# Patient Record
Sex: Female | Born: 1960 | Hispanic: Refuse to answer | Smoking: Current every day smoker
Health system: Southern US, Community
[De-identification: ages and names within clinical notes are randomized; demographics above are authoritative.]

## PROBLEM LIST (undated history)

## (undated) DIAGNOSIS — D649 Anemia, unspecified: Secondary | ICD-10-CM

## (undated) HISTORY — PX: TUBAL LIGATION: SHX77

## (undated) HISTORY — PX: ROTATOR CUFF REPAIR: SHX139

---

## 2015-06-30 DIAGNOSIS — E559 Vitamin D deficiency, unspecified: Secondary | ICD-10-CM | POA: Diagnosis not present

## 2015-06-30 DIAGNOSIS — R6882 Decreased libido: Secondary | ICD-10-CM | POA: Diagnosis not present

## 2015-06-30 DIAGNOSIS — D649 Anemia, unspecified: Secondary | ICD-10-CM | POA: Diagnosis not present

## 2015-06-30 DIAGNOSIS — Z0389 Encounter for observation for other suspected diseases and conditions ruled out: Secondary | ICD-10-CM | POA: Diagnosis not present

## 2015-06-30 DIAGNOSIS — D519 Vitamin B12 deficiency anemia, unspecified: Secondary | ICD-10-CM | POA: Diagnosis not present

## 2015-06-30 DIAGNOSIS — Z13220 Encounter for screening for lipoid disorders: Secondary | ICD-10-CM | POA: Diagnosis not present

## 2015-06-30 DIAGNOSIS — E2839 Other primary ovarian failure: Secondary | ICD-10-CM | POA: Diagnosis not present

## 2015-06-30 DIAGNOSIS — R5383 Other fatigue: Secondary | ICD-10-CM | POA: Diagnosis not present

## 2015-09-21 DIAGNOSIS — E2839 Other primary ovarian failure: Secondary | ICD-10-CM | POA: Diagnosis not present

## 2015-09-21 DIAGNOSIS — E559 Vitamin D deficiency, unspecified: Secondary | ICD-10-CM | POA: Diagnosis not present

## 2015-09-21 DIAGNOSIS — R5383 Other fatigue: Secondary | ICD-10-CM | POA: Diagnosis not present

## 2015-11-03 DIAGNOSIS — D649 Anemia, unspecified: Secondary | ICD-10-CM | POA: Diagnosis not present

## 2015-11-03 DIAGNOSIS — E559 Vitamin D deficiency, unspecified: Secondary | ICD-10-CM | POA: Diagnosis not present

## 2015-11-03 DIAGNOSIS — E2839 Other primary ovarian failure: Secondary | ICD-10-CM | POA: Diagnosis not present

## 2015-12-08 DIAGNOSIS — E2839 Other primary ovarian failure: Secondary | ICD-10-CM | POA: Diagnosis not present

## 2015-12-08 DIAGNOSIS — D649 Anemia, unspecified: Secondary | ICD-10-CM | POA: Diagnosis not present

## 2016-02-10 DIAGNOSIS — E2839 Other primary ovarian failure: Secondary | ICD-10-CM | POA: Diagnosis not present

## 2016-02-10 DIAGNOSIS — E559 Vitamin D deficiency, unspecified: Secondary | ICD-10-CM | POA: Diagnosis not present

## 2016-02-10 DIAGNOSIS — Z713 Dietary counseling and surveillance: Secondary | ICD-10-CM | POA: Diagnosis not present

## 2016-02-10 DIAGNOSIS — D649 Anemia, unspecified: Secondary | ICD-10-CM | POA: Diagnosis not present

## 2016-05-11 DIAGNOSIS — E559 Vitamin D deficiency, unspecified: Secondary | ICD-10-CM | POA: Diagnosis not present

## 2016-05-11 DIAGNOSIS — E2839 Other primary ovarian failure: Secondary | ICD-10-CM | POA: Diagnosis not present

## 2016-05-11 DIAGNOSIS — D649 Anemia, unspecified: Secondary | ICD-10-CM | POA: Diagnosis not present

## 2016-05-19 DIAGNOSIS — D229 Melanocytic nevi, unspecified: Secondary | ICD-10-CM | POA: Diagnosis not present

## 2016-06-22 DIAGNOSIS — R635 Abnormal weight gain: Secondary | ICD-10-CM | POA: Diagnosis not present

## 2016-06-22 DIAGNOSIS — D229 Melanocytic nevi, unspecified: Secondary | ICD-10-CM | POA: Diagnosis not present

## 2016-06-22 DIAGNOSIS — E2839 Other primary ovarian failure: Secondary | ICD-10-CM | POA: Diagnosis not present

## 2016-06-27 DIAGNOSIS — Z720 Tobacco use: Secondary | ICD-10-CM | POA: Diagnosis not present

## 2016-06-27 DIAGNOSIS — R635 Abnormal weight gain: Secondary | ICD-10-CM | POA: Diagnosis not present

## 2016-06-27 DIAGNOSIS — Z9889 Other specified postprocedural states: Secondary | ICD-10-CM | POA: Diagnosis not present

## 2016-06-27 DIAGNOSIS — D518 Other vitamin B12 deficiency anemias: Secondary | ICD-10-CM | POA: Diagnosis not present

## 2016-06-27 DIAGNOSIS — Z6828 Body mass index (BMI) 28.0-28.9, adult: Secondary | ICD-10-CM | POA: Diagnosis not present

## 2016-07-07 DIAGNOSIS — E782 Mixed hyperlipidemia: Secondary | ICD-10-CM | POA: Diagnosis not present

## 2016-07-07 DIAGNOSIS — D518 Other vitamin B12 deficiency anemias: Secondary | ICD-10-CM | POA: Diagnosis not present

## 2016-07-11 DIAGNOSIS — Z6828 Body mass index (BMI) 28.0-28.9, adult: Secondary | ICD-10-CM | POA: Diagnosis not present

## 2016-07-11 DIAGNOSIS — D518 Other vitamin B12 deficiency anemias: Secondary | ICD-10-CM | POA: Diagnosis not present

## 2016-07-11 DIAGNOSIS — Z713 Dietary counseling and surveillance: Secondary | ICD-10-CM | POA: Diagnosis not present

## 2016-07-11 DIAGNOSIS — R635 Abnormal weight gain: Secondary | ICD-10-CM | POA: Diagnosis not present

## 2016-07-11 DIAGNOSIS — Z9889 Other specified postprocedural states: Secondary | ICD-10-CM | POA: Diagnosis not present

## 2016-07-11 DIAGNOSIS — Z720 Tobacco use: Secondary | ICD-10-CM | POA: Diagnosis not present

## 2016-10-10 DIAGNOSIS — Z713 Dietary counseling and surveillance: Secondary | ICD-10-CM | POA: Diagnosis not present

## 2016-10-10 DIAGNOSIS — Z720 Tobacco use: Secondary | ICD-10-CM | POA: Diagnosis not present

## 2016-10-10 DIAGNOSIS — R638 Other symptoms and signs concerning food and fluid intake: Secondary | ICD-10-CM | POA: Diagnosis not present

## 2016-10-10 DIAGNOSIS — Z6826 Body mass index (BMI) 26.0-26.9, adult: Secondary | ICD-10-CM | POA: Diagnosis not present

## 2016-11-02 DIAGNOSIS — B079 Viral wart, unspecified: Secondary | ICD-10-CM | POA: Diagnosis not present

## 2017-05-17 DIAGNOSIS — M542 Cervicalgia: Secondary | ICD-10-CM | POA: Diagnosis not present

## 2017-05-17 DIAGNOSIS — M9901 Segmental and somatic dysfunction of cervical region: Secondary | ICD-10-CM | POA: Diagnosis not present

## 2017-06-20 DIAGNOSIS — Z0000 Encounter for general adult medical examination without abnormal findings: Secondary | ICD-10-CM | POA: Diagnosis not present

## 2017-06-20 DIAGNOSIS — D518 Other vitamin B12 deficiency anemias: Secondary | ICD-10-CM | POA: Diagnosis not present

## 2017-06-20 DIAGNOSIS — E6609 Other obesity due to excess calories: Secondary | ICD-10-CM | POA: Diagnosis not present

## 2017-06-23 DIAGNOSIS — M542 Cervicalgia: Secondary | ICD-10-CM | POA: Diagnosis not present

## 2017-06-23 DIAGNOSIS — M9901 Segmental and somatic dysfunction of cervical region: Secondary | ICD-10-CM | POA: Diagnosis not present

## 2017-06-23 DIAGNOSIS — H5589 Other irregular eye movements: Secondary | ICD-10-CM | POA: Diagnosis not present

## 2017-06-30 DIAGNOSIS — M9901 Segmental and somatic dysfunction of cervical region: Secondary | ICD-10-CM | POA: Diagnosis not present

## 2017-06-30 DIAGNOSIS — M542 Cervicalgia: Secondary | ICD-10-CM | POA: Diagnosis not present

## 2017-07-07 DIAGNOSIS — M9901 Segmental and somatic dysfunction of cervical region: Secondary | ICD-10-CM | POA: Diagnosis not present

## 2017-07-07 DIAGNOSIS — M542 Cervicalgia: Secondary | ICD-10-CM | POA: Diagnosis not present

## 2017-07-21 DIAGNOSIS — M542 Cervicalgia: Secondary | ICD-10-CM | POA: Diagnosis not present

## 2017-07-21 DIAGNOSIS — M9901 Segmental and somatic dysfunction of cervical region: Secondary | ICD-10-CM | POA: Diagnosis not present

## 2017-08-01 DIAGNOSIS — M542 Cervicalgia: Secondary | ICD-10-CM | POA: Diagnosis not present

## 2017-08-01 DIAGNOSIS — M9901 Segmental and somatic dysfunction of cervical region: Secondary | ICD-10-CM | POA: Diagnosis not present

## 2017-08-03 DIAGNOSIS — H5589 Other irregular eye movements: Secondary | ICD-10-CM | POA: Diagnosis not present

## 2017-08-03 DIAGNOSIS — Z202 Contact with and (suspected) exposure to infections with a predominantly sexual mode of transmission: Secondary | ICD-10-CM | POA: Diagnosis not present

## 2017-08-03 DIAGNOSIS — Z124 Encounter for screening for malignant neoplasm of cervix: Secondary | ICD-10-CM | POA: Diagnosis not present

## 2017-08-03 DIAGNOSIS — D518 Other vitamin B12 deficiency anemias: Secondary | ICD-10-CM | POA: Diagnosis not present

## 2017-08-03 DIAGNOSIS — Z0000 Encounter for general adult medical examination without abnormal findings: Secondary | ICD-10-CM | POA: Diagnosis not present

## 2017-08-03 DIAGNOSIS — Z6827 Body mass index (BMI) 27.0-27.9, adult: Secondary | ICD-10-CM | POA: Diagnosis not present

## 2017-12-21 DIAGNOSIS — R5383 Other fatigue: Secondary | ICD-10-CM | POA: Diagnosis not present

## 2017-12-21 DIAGNOSIS — Z202 Contact with and (suspected) exposure to infections with a predominantly sexual mode of transmission: Secondary | ICD-10-CM | POA: Diagnosis not present

## 2017-12-21 DIAGNOSIS — Z124 Encounter for screening for malignant neoplasm of cervix: Secondary | ICD-10-CM | POA: Diagnosis not present

## 2017-12-21 DIAGNOSIS — E0590 Thyrotoxicosis, unspecified without thyrotoxic crisis or storm: Secondary | ICD-10-CM | POA: Diagnosis not present

## 2017-12-21 DIAGNOSIS — D518 Other vitamin B12 deficiency anemias: Secondary | ICD-10-CM | POA: Diagnosis not present

## 2017-12-21 DIAGNOSIS — H5589 Other irregular eye movements: Secondary | ICD-10-CM | POA: Diagnosis not present

## 2017-12-21 DIAGNOSIS — Z6827 Body mass index (BMI) 27.0-27.9, adult: Secondary | ICD-10-CM | POA: Diagnosis not present

## 2017-12-21 DIAGNOSIS — Z0000 Encounter for general adult medical examination without abnormal findings: Secondary | ICD-10-CM | POA: Diagnosis not present

## 2017-12-21 DIAGNOSIS — E559 Vitamin D deficiency, unspecified: Secondary | ICD-10-CM | POA: Diagnosis not present

## 2017-12-28 DIAGNOSIS — Z6826 Body mass index (BMI) 26.0-26.9, adult: Secondary | ICD-10-CM | POA: Diagnosis not present

## 2017-12-28 DIAGNOSIS — E559 Vitamin D deficiency, unspecified: Secondary | ICD-10-CM | POA: Diagnosis not present

## 2017-12-28 DIAGNOSIS — D518 Other vitamin B12 deficiency anemias: Secondary | ICD-10-CM | POA: Diagnosis not present

## 2017-12-28 DIAGNOSIS — E0590 Thyrotoxicosis, unspecified without thyrotoxic crisis or storm: Secondary | ICD-10-CM | POA: Diagnosis not present

## 2018-02-16 DIAGNOSIS — Z0000 Encounter for general adult medical examination without abnormal findings: Secondary | ICD-10-CM | POA: Diagnosis not present

## 2018-02-16 DIAGNOSIS — Z202 Contact with and (suspected) exposure to infections with a predominantly sexual mode of transmission: Secondary | ICD-10-CM | POA: Diagnosis not present

## 2018-02-16 DIAGNOSIS — D518 Other vitamin B12 deficiency anemias: Secondary | ICD-10-CM | POA: Diagnosis not present

## 2018-02-16 DIAGNOSIS — Z124 Encounter for screening for malignant neoplasm of cervix: Secondary | ICD-10-CM | POA: Diagnosis not present

## 2018-02-16 DIAGNOSIS — R5383 Other fatigue: Secondary | ICD-10-CM | POA: Diagnosis not present

## 2018-02-16 DIAGNOSIS — Z6826 Body mass index (BMI) 26.0-26.9, adult: Secondary | ICD-10-CM | POA: Diagnosis not present

## 2018-02-16 DIAGNOSIS — E0590 Thyrotoxicosis, unspecified without thyrotoxic crisis or storm: Secondary | ICD-10-CM | POA: Diagnosis not present

## 2018-02-16 DIAGNOSIS — H5589 Other irregular eye movements: Secondary | ICD-10-CM | POA: Diagnosis not present

## 2018-02-16 DIAGNOSIS — E559 Vitamin D deficiency, unspecified: Secondary | ICD-10-CM | POA: Diagnosis not present

## 2018-02-21 DIAGNOSIS — Z6827 Body mass index (BMI) 27.0-27.9, adult: Secondary | ICD-10-CM | POA: Diagnosis not present

## 2018-02-21 DIAGNOSIS — E0590 Thyrotoxicosis, unspecified without thyrotoxic crisis or storm: Secondary | ICD-10-CM | POA: Diagnosis not present

## 2018-02-21 DIAGNOSIS — D518 Other vitamin B12 deficiency anemias: Secondary | ICD-10-CM | POA: Diagnosis not present

## 2018-05-31 DIAGNOSIS — M9901 Segmental and somatic dysfunction of cervical region: Secondary | ICD-10-CM | POA: Diagnosis not present

## 2018-05-31 DIAGNOSIS — M542 Cervicalgia: Secondary | ICD-10-CM | POA: Diagnosis not present

## 2018-05-31 DIAGNOSIS — M9902 Segmental and somatic dysfunction of thoracic region: Secondary | ICD-10-CM | POA: Diagnosis not present

## 2018-06-12 DIAGNOSIS — E559 Vitamin D deficiency, unspecified: Secondary | ICD-10-CM | POA: Diagnosis not present

## 2018-06-12 DIAGNOSIS — D518 Other vitamin B12 deficiency anemias: Secondary | ICD-10-CM | POA: Diagnosis not present

## 2018-06-12 DIAGNOSIS — E0590 Thyrotoxicosis, unspecified without thyrotoxic crisis or storm: Secondary | ICD-10-CM | POA: Diagnosis not present

## 2018-06-12 DIAGNOSIS — Z6827 Body mass index (BMI) 27.0-27.9, adult: Secondary | ICD-10-CM | POA: Diagnosis not present

## 2018-06-12 DIAGNOSIS — Z6826 Body mass index (BMI) 26.0-26.9, adult: Secondary | ICD-10-CM | POA: Diagnosis not present

## 2018-06-21 DIAGNOSIS — D519 Vitamin B12 deficiency anemia, unspecified: Secondary | ICD-10-CM | POA: Diagnosis not present

## 2018-06-21 DIAGNOSIS — R946 Abnormal results of thyroid function studies: Secondary | ICD-10-CM | POA: Diagnosis not present

## 2018-06-21 DIAGNOSIS — N9985 Post endometrial ablation syndrome: Secondary | ICD-10-CM | POA: Diagnosis not present

## 2018-06-21 DIAGNOSIS — E559 Vitamin D deficiency, unspecified: Secondary | ICD-10-CM | POA: Diagnosis not present

## 2018-06-21 DIAGNOSIS — Z0000 Encounter for general adult medical examination without abnormal findings: Secondary | ICD-10-CM | POA: Diagnosis not present

## 2018-07-09 ENCOUNTER — Other Ambulatory Visit (HOSPITAL_COMMUNITY): Payer: Self-pay | Admitting: Internal Medicine

## 2018-07-09 DIAGNOSIS — Z780 Asymptomatic menopausal state: Secondary | ICD-10-CM

## 2018-07-09 DIAGNOSIS — Z1231 Encounter for screening mammogram for malignant neoplasm of breast: Secondary | ICD-10-CM

## 2018-08-02 ENCOUNTER — Ambulatory Visit (HOSPITAL_COMMUNITY): Payer: Self-pay

## 2018-08-08 ENCOUNTER — Inpatient Hospital Stay (HOSPITAL_COMMUNITY): Admission: RE | Admit: 2018-08-08 | Payer: Self-pay | Source: Ambulatory Visit

## 2018-08-08 ENCOUNTER — Other Ambulatory Visit (HOSPITAL_COMMUNITY): Payer: Self-pay

## 2018-08-09 ENCOUNTER — Ambulatory Visit (HOSPITAL_COMMUNITY): Payer: Self-pay

## 2018-08-09 ENCOUNTER — Encounter (HOSPITAL_COMMUNITY): Payer: Self-pay

## 2018-08-09 ENCOUNTER — Other Ambulatory Visit (HOSPITAL_COMMUNITY): Payer: Self-pay

## 2018-11-12 DIAGNOSIS — H5210 Myopia, unspecified eye: Secondary | ICD-10-CM | POA: Diagnosis not present

## 2018-11-12 DIAGNOSIS — Z0100 Encounter for examination of eyes and vision without abnormal findings: Secondary | ICD-10-CM | POA: Diagnosis not present

## 2018-12-26 DIAGNOSIS — H00011 Hordeolum externum right upper eyelid: Secondary | ICD-10-CM | POA: Diagnosis not present

## 2019-07-01 DIAGNOSIS — D518 Other vitamin B12 deficiency anemias: Secondary | ICD-10-CM | POA: Diagnosis not present

## 2019-07-01 DIAGNOSIS — E559 Vitamin D deficiency, unspecified: Secondary | ICD-10-CM | POA: Diagnosis not present

## 2019-07-01 DIAGNOSIS — E0590 Thyrotoxicosis, unspecified without thyrotoxic crisis or storm: Secondary | ICD-10-CM | POA: Diagnosis not present

## 2019-07-04 DIAGNOSIS — Z0001 Encounter for general adult medical examination with abnormal findings: Secondary | ICD-10-CM | POA: Diagnosis not present

## 2019-07-04 DIAGNOSIS — D519 Vitamin B12 deficiency anemia, unspecified: Secondary | ICD-10-CM | POA: Diagnosis not present

## 2019-07-04 DIAGNOSIS — R946 Abnormal results of thyroid function studies: Secondary | ICD-10-CM | POA: Diagnosis not present

## 2019-07-04 DIAGNOSIS — E559 Vitamin D deficiency, unspecified: Secondary | ICD-10-CM | POA: Diagnosis not present

## 2019-07-04 DIAGNOSIS — N9985 Post endometrial ablation syndrome: Secondary | ICD-10-CM | POA: Diagnosis not present

## 2019-11-14 ENCOUNTER — Other Ambulatory Visit (HOSPITAL_COMMUNITY): Payer: Self-pay | Admitting: Internal Medicine

## 2019-11-14 DIAGNOSIS — Z1231 Encounter for screening mammogram for malignant neoplasm of breast: Secondary | ICD-10-CM

## 2019-11-14 DIAGNOSIS — Z13820 Encounter for screening for osteoporosis: Secondary | ICD-10-CM

## 2019-11-22 ENCOUNTER — Ambulatory Visit (HOSPITAL_COMMUNITY)
Admission: RE | Admit: 2019-11-22 | Discharge: 2019-11-22 | Disposition: A | Discharge status: Home / Self Care | Payer: Medicare HMO | Source: Ambulatory Visit | Attending: Internal Medicine | Admitting: Internal Medicine

## 2019-11-22 ENCOUNTER — Other Ambulatory Visit: Payer: Self-pay

## 2019-11-22 DIAGNOSIS — Z13820 Encounter for screening for osteoporosis: Secondary | ICD-10-CM | POA: Diagnosis not present

## 2019-11-22 DIAGNOSIS — Z780 Asymptomatic menopausal state: Secondary | ICD-10-CM | POA: Diagnosis not present

## 2019-11-22 DIAGNOSIS — Z1231 Encounter for screening mammogram for malignant neoplasm of breast: Secondary | ICD-10-CM

## 2020-06-19 DIAGNOSIS — M9902 Segmental and somatic dysfunction of thoracic region: Secondary | ICD-10-CM | POA: Diagnosis not present

## 2020-06-19 DIAGNOSIS — M9901 Segmental and somatic dysfunction of cervical region: Secondary | ICD-10-CM | POA: Diagnosis not present

## 2020-06-19 DIAGNOSIS — M542 Cervicalgia: Secondary | ICD-10-CM | POA: Diagnosis not present

## 2020-06-19 DIAGNOSIS — M546 Pain in thoracic spine: Secondary | ICD-10-CM | POA: Diagnosis not present

## 2020-06-26 DIAGNOSIS — M546 Pain in thoracic spine: Secondary | ICD-10-CM | POA: Diagnosis not present

## 2020-06-26 DIAGNOSIS — M9902 Segmental and somatic dysfunction of thoracic region: Secondary | ICD-10-CM | POA: Diagnosis not present

## 2020-06-26 DIAGNOSIS — M542 Cervicalgia: Secondary | ICD-10-CM | POA: Diagnosis not present

## 2020-06-26 DIAGNOSIS — M9901 Segmental and somatic dysfunction of cervical region: Secondary | ICD-10-CM | POA: Diagnosis not present

## 2020-07-03 DIAGNOSIS — M9901 Segmental and somatic dysfunction of cervical region: Secondary | ICD-10-CM | POA: Diagnosis not present

## 2020-07-03 DIAGNOSIS — M9902 Segmental and somatic dysfunction of thoracic region: Secondary | ICD-10-CM | POA: Diagnosis not present

## 2020-07-03 DIAGNOSIS — M546 Pain in thoracic spine: Secondary | ICD-10-CM | POA: Diagnosis not present

## 2020-07-03 DIAGNOSIS — M542 Cervicalgia: Secondary | ICD-10-CM | POA: Diagnosis not present

## 2020-07-06 DIAGNOSIS — Z0000 Encounter for general adult medical examination without abnormal findings: Secondary | ICD-10-CM | POA: Diagnosis not present

## 2020-07-06 DIAGNOSIS — E559 Vitamin D deficiency, unspecified: Secondary | ICD-10-CM | POA: Diagnosis not present

## 2020-07-06 DIAGNOSIS — Z202 Contact with and (suspected) exposure to infections with a predominantly sexual mode of transmission: Secondary | ICD-10-CM | POA: Diagnosis not present

## 2020-07-06 DIAGNOSIS — Z0001 Encounter for general adult medical examination with abnormal findings: Secondary | ICD-10-CM | POA: Diagnosis not present

## 2020-07-06 DIAGNOSIS — Z124 Encounter for screening for malignant neoplasm of cervix: Secondary | ICD-10-CM | POA: Diagnosis not present

## 2020-07-06 DIAGNOSIS — R5383 Other fatigue: Secondary | ICD-10-CM | POA: Diagnosis not present

## 2020-07-06 DIAGNOSIS — R7309 Other abnormal glucose: Secondary | ICD-10-CM | POA: Diagnosis not present

## 2020-07-06 DIAGNOSIS — Z6826 Body mass index (BMI) 26.0-26.9, adult: Secondary | ICD-10-CM | POA: Diagnosis not present

## 2020-07-06 DIAGNOSIS — D518 Other vitamin B12 deficiency anemias: Secondary | ICD-10-CM | POA: Diagnosis not present

## 2020-07-06 DIAGNOSIS — H5589 Other irregular eye movements: Secondary | ICD-10-CM | POA: Diagnosis not present

## 2020-07-10 DIAGNOSIS — M546 Pain in thoracic spine: Secondary | ICD-10-CM | POA: Diagnosis not present

## 2020-07-10 DIAGNOSIS — M542 Cervicalgia: Secondary | ICD-10-CM | POA: Diagnosis not present

## 2020-07-10 DIAGNOSIS — M9902 Segmental and somatic dysfunction of thoracic region: Secondary | ICD-10-CM | POA: Diagnosis not present

## 2020-07-10 DIAGNOSIS — M9901 Segmental and somatic dysfunction of cervical region: Secondary | ICD-10-CM | POA: Diagnosis not present

## 2020-07-20 DIAGNOSIS — M546 Pain in thoracic spine: Secondary | ICD-10-CM | POA: Diagnosis not present

## 2020-07-20 DIAGNOSIS — M542 Cervicalgia: Secondary | ICD-10-CM | POA: Diagnosis not present

## 2020-07-20 DIAGNOSIS — M9902 Segmental and somatic dysfunction of thoracic region: Secondary | ICD-10-CM | POA: Diagnosis not present

## 2020-07-20 DIAGNOSIS — M9901 Segmental and somatic dysfunction of cervical region: Secondary | ICD-10-CM | POA: Diagnosis not present

## 2020-07-21 DIAGNOSIS — R69 Illness, unspecified: Secondary | ICD-10-CM | POA: Diagnosis not present

## 2020-07-24 DIAGNOSIS — E559 Vitamin D deficiency, unspecified: Secondary | ICD-10-CM | POA: Diagnosis not present

## 2020-07-24 DIAGNOSIS — N9985 Post endometrial ablation syndrome: Secondary | ICD-10-CM | POA: Diagnosis not present

## 2020-07-24 DIAGNOSIS — R946 Abnormal results of thyroid function studies: Secondary | ICD-10-CM | POA: Diagnosis not present

## 2020-07-24 DIAGNOSIS — Z0001 Encounter for general adult medical examination with abnormal findings: Secondary | ICD-10-CM | POA: Diagnosis not present

## 2020-07-24 DIAGNOSIS — F17210 Nicotine dependence, cigarettes, uncomplicated: Secondary | ICD-10-CM | POA: Diagnosis not present

## 2020-07-24 DIAGNOSIS — Z716 Tobacco abuse counseling: Secondary | ICD-10-CM | POA: Diagnosis not present

## 2020-07-24 DIAGNOSIS — D519 Vitamin B12 deficiency anemia, unspecified: Secondary | ICD-10-CM | POA: Diagnosis not present

## 2020-07-31 DIAGNOSIS — M542 Cervicalgia: Secondary | ICD-10-CM | POA: Diagnosis not present

## 2020-07-31 DIAGNOSIS — M546 Pain in thoracic spine: Secondary | ICD-10-CM | POA: Diagnosis not present

## 2020-07-31 DIAGNOSIS — M9901 Segmental and somatic dysfunction of cervical region: Secondary | ICD-10-CM | POA: Diagnosis not present

## 2020-07-31 DIAGNOSIS — M9902 Segmental and somatic dysfunction of thoracic region: Secondary | ICD-10-CM | POA: Diagnosis not present

## 2020-08-05 ENCOUNTER — Encounter: Payer: Self-pay | Admitting: *Deleted

## 2020-08-19 DIAGNOSIS — M9902 Segmental and somatic dysfunction of thoracic region: Secondary | ICD-10-CM | POA: Diagnosis not present

## 2020-08-19 DIAGNOSIS — M546 Pain in thoracic spine: Secondary | ICD-10-CM | POA: Diagnosis not present

## 2020-08-19 DIAGNOSIS — M542 Cervicalgia: Secondary | ICD-10-CM | POA: Diagnosis not present

## 2020-08-19 DIAGNOSIS — M9901 Segmental and somatic dysfunction of cervical region: Secondary | ICD-10-CM | POA: Diagnosis not present

## 2020-08-31 ENCOUNTER — Ambulatory Visit: Payer: Medicare HMO

## 2020-10-21 ENCOUNTER — Ambulatory Visit: Payer: Medicare HMO

## 2020-10-26 DIAGNOSIS — Z0001 Encounter for general adult medical examination with abnormal findings: Secondary | ICD-10-CM | POA: Diagnosis not present

## 2020-10-26 DIAGNOSIS — D649 Anemia, unspecified: Secondary | ICD-10-CM | POA: Diagnosis not present

## 2020-10-26 DIAGNOSIS — E559 Vitamin D deficiency, unspecified: Secondary | ICD-10-CM | POA: Diagnosis not present

## 2020-10-27 IMAGING — MG DIGITAL SCREENING BILAT W/ TOMO W/ CAD
6 of 10 series · 6 of 30 positions shown · non-contrast
Comparison: None.

CLINICAL DATA: Screening.

EXAM:
DIGITAL SCREENING BILATERAL MAMMOGRAM WITH TOMO AND CAD

[L MLO synth-2D]
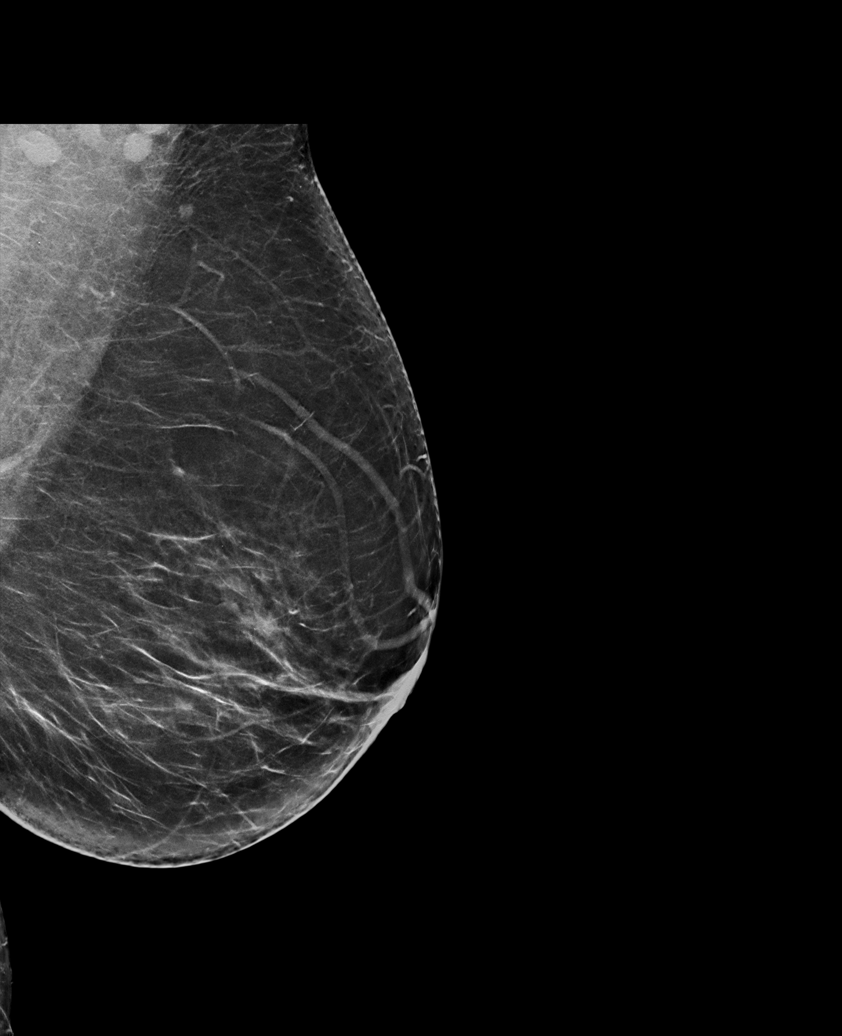

[L CC synth-2D (1 of 2)]
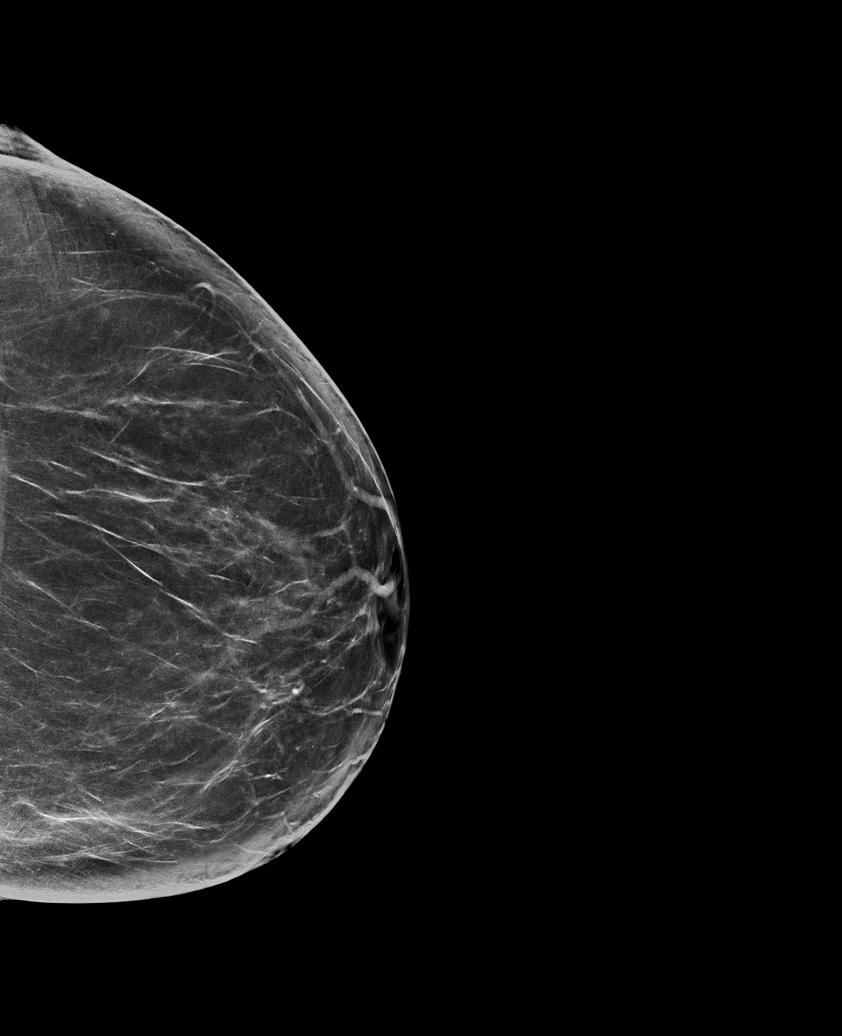

[L CC synth-2D (2 of 2)]
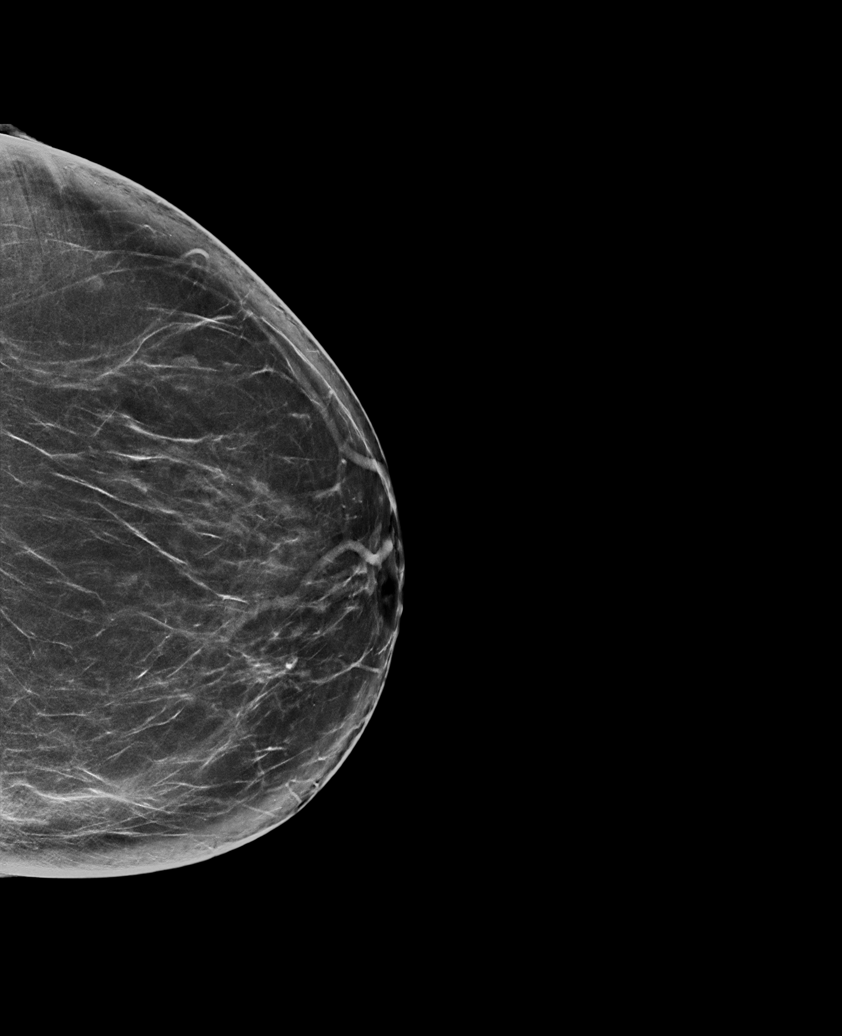

[R MLO synth-2D]
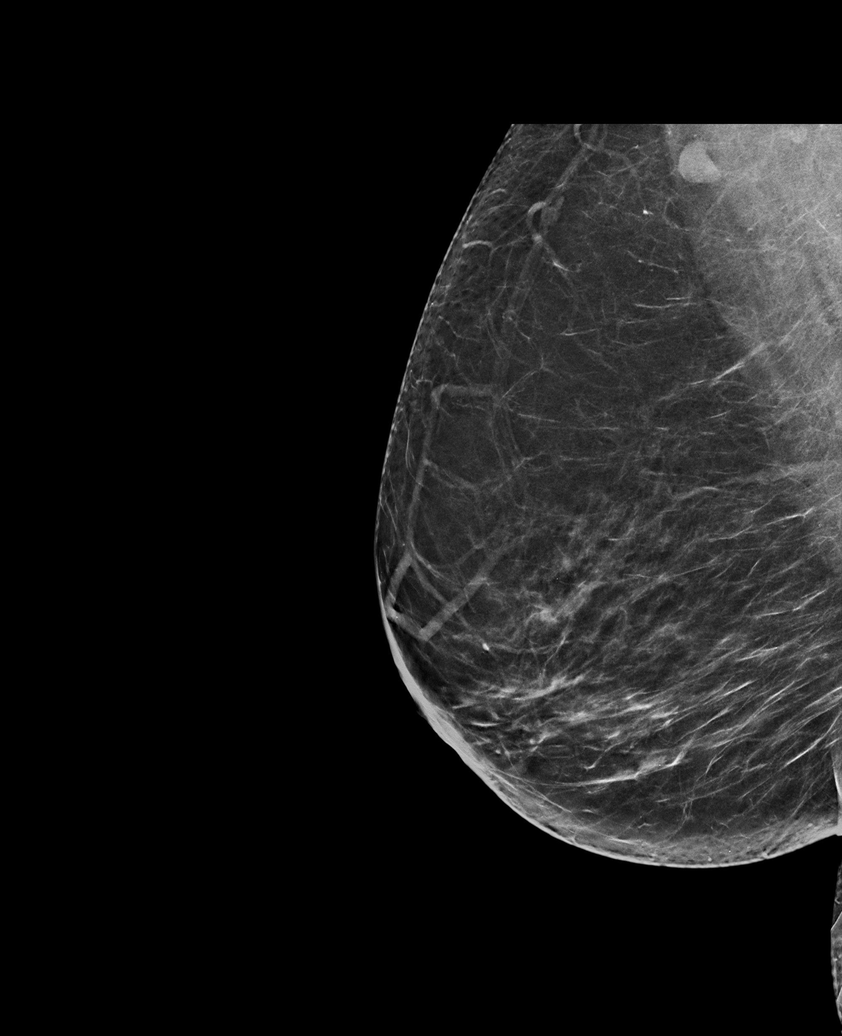

[R CC synth-2D]
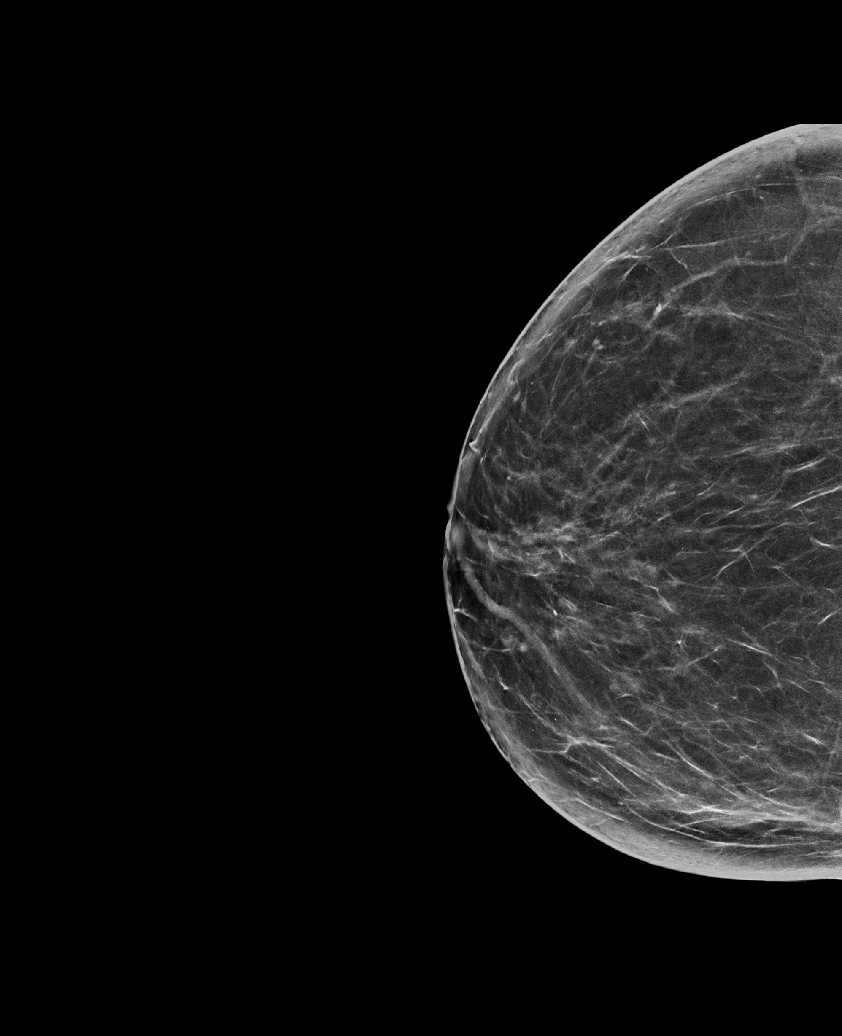

[R CC tomo · tomo slice 37/73.0]
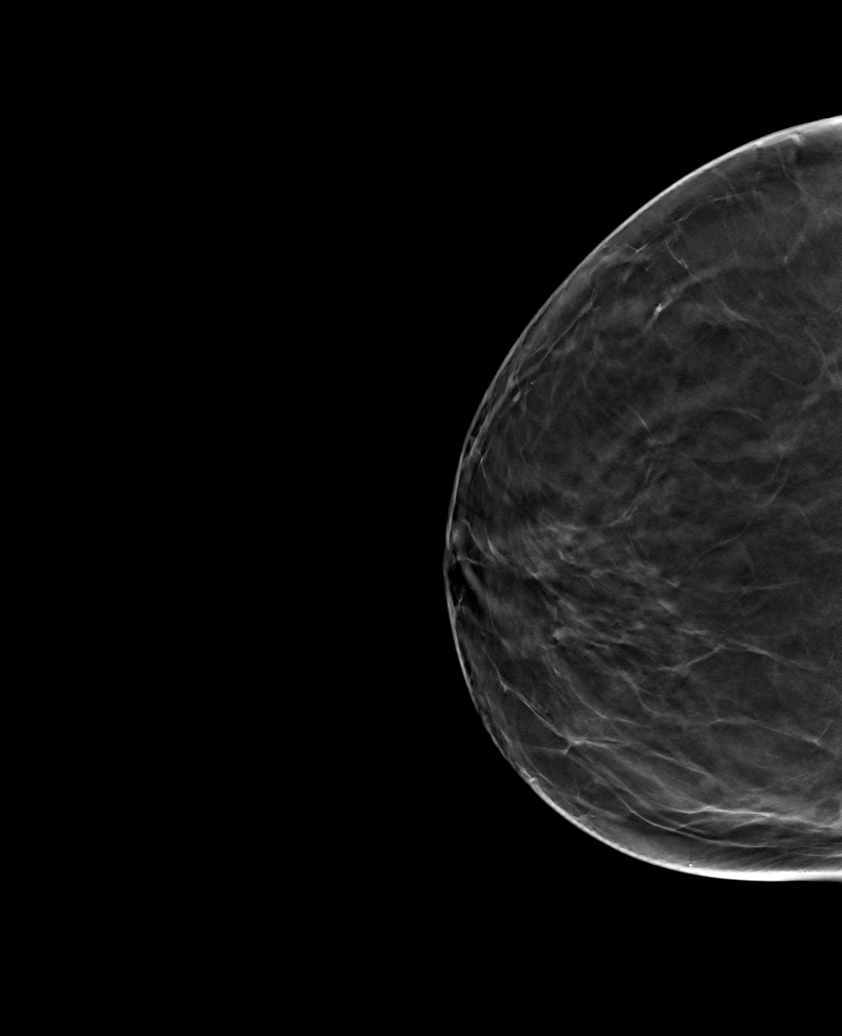

[6 of 30 positions shown; findings below may reference images not displayed]

ACR Breast Density Category b: There are scattered areas of
fibroglandular density.
FINDINGS: There are no findings suspicious for malignancy. Images were
processed with CAD.
IMPRESSION: No mammographic evidence of malignancy. A result letter of this
screening mammogram will be mailed directly to the patient.

RECOMMENDATION:
Screening mammogram in one year. (Code:Y5-G-EJ6)

BI-RADS CATEGORY  1: Negative.

## 2020-10-30 DIAGNOSIS — E559 Vitamin D deficiency, unspecified: Secondary | ICD-10-CM | POA: Diagnosis not present

## 2020-10-30 DIAGNOSIS — R7301 Impaired fasting glucose: Secondary | ICD-10-CM | POA: Diagnosis not present

## 2020-10-30 DIAGNOSIS — R946 Abnormal results of thyroid function studies: Secondary | ICD-10-CM | POA: Diagnosis not present

## 2020-10-30 DIAGNOSIS — R55 Syncope and collapse: Secondary | ICD-10-CM | POA: Diagnosis not present

## 2020-12-15 DIAGNOSIS — Z0100 Encounter for examination of eyes and vision without abnormal findings: Secondary | ICD-10-CM | POA: Diagnosis not present

## 2020-12-15 DIAGNOSIS — H52229 Regular astigmatism, unspecified eye: Secondary | ICD-10-CM | POA: Diagnosis not present

## 2020-12-16 ENCOUNTER — Other Ambulatory Visit (HOSPITAL_COMMUNITY): Payer: Self-pay | Admitting: Internal Medicine

## 2020-12-16 DIAGNOSIS — Z1231 Encounter for screening mammogram for malignant neoplasm of breast: Secondary | ICD-10-CM

## 2020-12-23 ENCOUNTER — Ambulatory Visit (HOSPITAL_COMMUNITY): Payer: Medicare HMO

## 2021-01-04 ENCOUNTER — Ambulatory Visit
Admission: EM | Admit: 2021-01-04 | Discharge: 2021-01-04 | Disposition: A | Discharge status: Home / Self Care | Payer: Medicare HMO | Attending: Family Medicine | Admitting: Family Medicine

## 2021-01-04 ENCOUNTER — Encounter: Payer: Self-pay | Admitting: Emergency Medicine

## 2021-01-04 ENCOUNTER — Other Ambulatory Visit: Payer: Self-pay

## 2021-01-04 ENCOUNTER — Encounter: Payer: Self-pay | Admitting: Family Medicine

## 2021-01-04 DIAGNOSIS — I951 Orthostatic hypotension: Secondary | ICD-10-CM

## 2021-01-04 DIAGNOSIS — D649 Anemia, unspecified: Secondary | ICD-10-CM | POA: Insufficient documentation

## 2021-01-04 DIAGNOSIS — R55 Syncope and collapse: Secondary | ICD-10-CM

## 2021-01-04 DIAGNOSIS — R0789 Other chest pain: Secondary | ICD-10-CM

## 2021-01-04 DIAGNOSIS — H60543 Acute eczematoid otitis externa, bilateral: Secondary | ICD-10-CM | POA: Diagnosis not present

## 2021-01-04 DIAGNOSIS — Z862 Personal history of diseases of the blood and blood-forming organs and certain disorders involving the immune mechanism: Secondary | ICD-10-CM

## 2021-01-04 DIAGNOSIS — R42 Dizziness and giddiness: Secondary | ICD-10-CM

## 2021-01-04 HISTORY — DX: Anemia, unspecified: D64.9

## 2021-01-04 LAB — POCT URINALYSIS DIP (MANUAL ENTRY)
Bilirubin, UA: NEGATIVE
Blood, UA: NEGATIVE
Glucose, UA: NEGATIVE mg/dL
Ketones, POC UA: NEGATIVE mg/dL
Leukocytes, UA: NEGATIVE
Nitrite, UA: NEGATIVE
Protein Ur, POC: 30 mg/dL — AB
Spec Grav, UA: 1.025 (ref 1.010–1.025)
Urobilinogen, UA: 0.2 E.U./dL
pH, UA: 6 (ref 5.0–8.0)

## 2021-01-04 LAB — POCT FASTING CBG KUC MANUAL ENTRY: POCT Glucose (KUC): 163 mg/dL — AB (ref 70–99)

## 2021-01-04 MED ORDER — TRIAMCINOLONE ACETONIDE 0.025 % EX CREA: 1.0000 "application " | TOPICAL_CREAM | Freq: Two times a day (BID) | CUTANEOUS | 0 refills | Status: DC | PRN

## 2021-01-04 NOTE — ED Triage Notes (Signed)
Pt presents today with c/o of having two syncopal episode (x2 on Sat), left sided chest pain (intermittent) and bilateral ear pain. She reports that she has a long history of syncopal episodes from low hemoglobin and feels chest pain could be from lifting weights at the gym on Friday. Denies n/v/d. No SOB.

## 2021-01-04 NOTE — Discharge Instructions (Addendum)
Hydrate well with fluids.  Change positions slowly to reduce the risk of sudden drops in blood pressure which can cause you to pass out.  Follow-up with PCP regarding visit here today to discuss any further evaluation.  If any of your symptoms return go immediately to the emergency department.  I have collected blood work to check your hemoglobin status to ensure that you are not experiencing dizziness related to anemia.  We will notify you of any abnormal labs within 2 to 3 days.  If labs are normal our office will not contact you. I have also sent over cortisone cream for your external ear irritation (apply behind the ear and external auricle of the ear. Do not instill cream inside the ear. Apply as needed for dryness of skin involving the external ear canal.

## 2021-01-04 NOTE — ED Provider Notes (Signed)
[facility]    CSN: [id number] Arrival date & time: 01/04/21  [id number]      History   Chief Complaint Chief Complaint  Patient presents with   Loss of Consciousness   Chest Pain   Otalgia    HPI Sally Saunders is a 60 y.o. female.   HPI Patient with a history of postural orthostatic hypotension, questionable myasthenia gravis (reports questionable diagnosis as a child however was unable to tolerate extensive testing), recurrent syncope presents today following 2 episodes of near syncope 2 days ago.  She reports passing out however maintaining a level of conscious that she was aware of what was going on.  This occurred on 2 days ago.  She reports the second episode she experienced some dizziness and laid down on the floor to prevent herself from passing out.  She had no episodes x1 day ago and none today.  She also has experienced some left-sided chest achiness x3 days.  She reports adding weights during her workout routine on 3 days ago and the pain is reproducible with stretches and movement of her arm. She denies any chest pain or shortness of breath prior to the syncopal episodes that she experienced on Saturday.  Her blood pressure is rather low and reports being told she has had low blood pressures low and this is thought to be the reason that she has experienced syncope in the past.  She is eating and drinking normally.  Denies any unsteadiness on her feet.  Unrelated she is experienced some irritation and tenderness of the exterior ears bilaterally.  She has been told previously she has dry skin that causes irritation of her ears.  She has not attempted relief with any medication.  Past Medical History:  Diagnosis Date   Anemia     Patient Active Problem List   Diagnosis Date Noted   Anemia 01/04/2021    Past Surgical History:  Procedure Laterality Date   ROTATOR CUFF REPAIR Bilateral    TUBAL LIGATION      OB History   No obstetric history on file.       Home Medications    Prior to Admission medications   Medication Sig Start Date End Date Taking? Authorizing Provider  triamcinolone (KENALOG) 0.025 % cream Apply 1 application topically 2 (two) times daily as needed. PRN for external ear irritation. 01/04/21  Yes Scot Jun, FNP    Family History History reviewed. No pertinent family history.  Social History Social History   Tobacco Use   Smoking status: Every Day    Packs/day: 0.50    Types: Cigarettes   Smokeless tobacco: Never  Substance Use Topics   Alcohol use: Not Currently   Drug use: Not Currently     Allergies   Patient has no known allergies.   Review of Systems Review of Systems   Physical Exam Triage Vital Signs ED Triage Vitals  Enc Vitals Group     BP      Pulse      Resp      Temp      Temp src      SpO2      Weight      Height      Head Circumference      Peak Flow      Pain Score      Pain Loc      Pain Edu?      Excl. in [place]?    No data found.  Updated Vital Signs BP 101/71 (BP Location: Right Arm)   Pulse 94   Temp 99.1 F (37.3 C) (Oral)   Resp 18   Ht '5\' 7"'$  (1.702 m)   Wt 167 lb (75.8 kg)   SpO2 95%   BMI 26.16 kg/m   Visual Acuity Right Eye Distance:   Left Eye Distance:   Bilateral Distance:    Right Eye Near:   Left Eye Near:    Bilateral Near:     Physical Exam BP 101/71 (BP Location: Right Arm)   Pulse 94   Temp 99.1 F (37.3 C) (Oral)   Resp 18   Ht '5\' 7"'$  (1.702 m)   Wt 167 lb (75.8 kg)   SpO2 95%   BMI 26.16 kg/m   General Appearance:    Alert, cooperative, no distress, appears stated age  Head:    Normocephalic, without obvious abnormality, atraumatic  Eyes:    PERRL, conjunctiva/corneas clear, 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)    Ears:    Normal TM's and external ear canals dryness peeling skin present , both ears  Nose:   Nares normal, septum midline, mucosa normal, no drainage    or sinus tenderness  Throat:    Lips, mucosa, and tongue normal; teeth and gums normal  Neck:   Supple, symmetrical, trachea midline, no adenopathy;    thyroid:  no enlargement/tenderness/nodules; no carotid   bruit or JVD  Lungs:     Clear to auscultation bilaterally, respirations unlabored   Heart:    Regular rate and rhythm, S1 and S2 normal, no murmur, rub   or gallop  Extremities:   Extremities normal, atraumatic, no cyanosis or edema  Pulses:   2+ and symmetric all extremities  Skin:   Skin color, texture, turgor normal, no rashes or lesions  Neurologic:   Normal strength, 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  Procedures Procedures (including critical care time)  Medications Ordered in UC Medications - No data to display  Initial Impression / Assessment and Plan / UC Course  I have reviewed the triage vital signs and the nursing notes.  Pertinent labs & imaging results that were available during my care of the patient were reviewed by me and considered in my medical decision making (see chart for details).    Given patient's known history of orthostatic hypotension and patient's blood pressure is rather soft today suspect episodes occurring over the weekend involving near syncope related to drops and blood pressure.  Also patient's urine is slightly concentrated and encouraged her to hydrate  well with some fluids as this can also contribute to low blood pressure readings.  Given patient's history of anemia I will collect H&H to rule out any drops in hemoglobin as contributing to her symptoms over the  weekend.  Overall patient's exam is reassuring.  Her blood glucose is slightly elevated however she did eat prior to arrival here at urgent care therefore glucose is not fasting.  Left-sided chest tenderness is reproducible suspect this is related to exercise and activity.  Strict ER precautions given if any of her symptoms develop and worsen. Follow-up with primary care doctor as needed. Final Clinical Impressions(s) / UC Diagnoses   Final diagnoses:  Orthostatic hypotension  History of anemia  Postural dizziness with near syncope  Anemia, unspecified type  Dermatitis of both external auditory canals     Discharge Instructions      Hydrate well with fluids.  Change positions slowly to reduce the risk of sudden drops in blood pressure which can cause you to pass out.  Follow-up with PCP regarding visit here today to discuss any further evaluation.  If any of your symptoms return go immediately to the emergency department.  I have collected blood work to check your hemoglobin status to ensure that you are not experiencing dizziness related to anemia.  We will notify you of any abnormal labs within 2 to 3 days.  If labs are normal our office will not contact you. I have also sent over cortisone cream for your external ear irritation (apply behind the ear and external auricle of the ear. Do not instill cream inside the ear. Apply as needed for dryness of skin involving the external ear canal.     ED Prescriptions     Medication Sig Dispense Auth. Provider   triamcinolone (KENALOG) 0.025 % cream Apply 1 application topically 2 (two) times daily as needed. PRN for external ear irritation. 60 g Scot Jun, FNP      PDMP not reviewed this encounter.   Scot Jun,  [place] 01/04/21 [phone]

## 2021-01-06 ENCOUNTER — Ambulatory Visit (HOSPITAL_COMMUNITY): Payer: Medicare HMO

## 2021-01-07 ENCOUNTER — Ambulatory Visit (HOSPITAL_COMMUNITY)
Admission: RE | Admit: 2021-01-07 | Discharge: 2021-01-07 | Disposition: A | Discharge status: Home / Self Care | Payer: Medicare HMO | Source: Ambulatory Visit | Attending: Internal Medicine | Admitting: Internal Medicine

## 2021-01-07 ENCOUNTER — Other Ambulatory Visit: Payer: Self-pay

## 2021-01-07 DIAGNOSIS — Z1231 Encounter for screening mammogram for malignant neoplasm of breast: Secondary | ICD-10-CM | POA: Diagnosis not present

## 2021-01-07 LAB — HEMOGLOBIN AND HEMATOCRIT, BLOOD
Hematocrit: 50.1 % — ABNORMAL HIGH (ref 34.0–46.6)
Hemoglobin: 16 g/dL — ABNORMAL HIGH (ref 11.1–15.9)

## 2021-03-02 DIAGNOSIS — R55 Syncope and collapse: Secondary | ICD-10-CM | POA: Diagnosis not present

## 2021-03-02 DIAGNOSIS — Z1211 Encounter for screening for malignant neoplasm of colon: Secondary | ICD-10-CM | POA: Diagnosis not present

## 2021-03-08 ENCOUNTER — Encounter: Payer: Self-pay | Admitting: *Deleted

## 2021-03-24 ENCOUNTER — Ambulatory Visit: Payer: Medicare HMO

## 2021-03-30 ENCOUNTER — Ambulatory Visit: Payer: Medicare HMO

## 2021-06-07 DIAGNOSIS — R6889 Other general symptoms and signs: Secondary | ICD-10-CM | POA: Diagnosis not present

## 2021-06-08 ENCOUNTER — Ambulatory Visit (INDEPENDENT_AMBULATORY_CARE_PROVIDER_SITE_OTHER): Payer: Self-pay | Admitting: *Deleted

## 2021-06-08 ENCOUNTER — Other Ambulatory Visit: Payer: Self-pay

## 2021-06-08 VITALS — Ht 67.0 in | Wt 166.0 lb

## 2021-06-08 DIAGNOSIS — Z1211 Encounter for screening for malignant neoplasm of colon: Secondary | ICD-10-CM

## 2021-06-08 DIAGNOSIS — Z111 Encounter for screening for respiratory tuberculosis: Secondary | ICD-10-CM | POA: Diagnosis not present

## 2021-06-08 NOTE — Progress Notes (Signed)
Pt says that if her blood pressure drops, she passes out.

## 2021-06-08 NOTE — Progress Notes (Addendum)
Gastroenterology Pre-Procedure Review  Request Date: 06/08/2021 Requesting Physician: Perfecto Kingdom, FNP-C, Last TCS done in [place] over 10 years ago per pt, no polyps per pt  PATIENT REVIEW QUESTIONS: The patient responded to the following health history questions as indicated:    1. Diabetes Melitis: no 2. Joint replacements in the past 12 months: no 3. Major health problems in the past 3 months: no 4. Has an artificial valve or MVP: no 5. Has a defibrillator: no 6. Has been advised in past to take antibiotics in advance of a procedure like teeth cleaning: no 7. Family history of colon cancer: no  8. Alcohol Use: no 9. Illicit drug Use: no 10. History of sleep apnea: no  11. History of coronary artery or other vascular stents placed within the last 12 months: no 12. History of any prior anesthesia complications: yes, pt says laughing gas makes her heart race 13. Body mass index is 26 kg/m.    MEDICATIONS & ALLERGIES:    Patient reports the following regarding taking any blood thinners:   Plavix? no Aspirin? no Coumadin? no Brilinta? no Xarelto? no Eliquis? no Pradaxa? no Savaysa? no Effient? no  Patient confirms/reports the following medications:  Current Outpatient Medications  Medication Sig Dispense Refill   UNABLE TO FIND 1 capsule daily at 6 (six) AM. Med Name: Sage 100 mg     UNABLE TO FIND 1 capsule daily at 6 (six) AM. Med Name: Rosemary 100 mg     UNABLE TO FIND 1 capsule as needed. Med Name: Root Allergy     No current facility-administered medications for this visit.    Patient confirms/reports the following allergies:  No Known Allergies  No orders of the defined types were placed in this encounter.   AUTHORIZATION INFORMATION Primary Insurance: [employer],  ID #: [id number],  Group #: [id number] Pre-Cert / Josem Kaufmann required: Yes, approved online 4/48/1856-08/03/9700 Pre-Cert / Josem Kaufmann #: [id number]  SCHEDULE INFORMATION: Procedure has been scheduled as  follows:  Date: 07/16/2021, Time: 2:15 Location: [facility] with Dr. Abbey Chatters  This Gastroenterology Pre-Precedure Review Form is being routed to the following provider(s): Roseanne Kaufman, NP

## 2021-06-10 DIAGNOSIS — Z23 Encounter for immunization: Secondary | ICD-10-CM | POA: Diagnosis not present

## 2021-06-14 DIAGNOSIS — R6889 Other general symptoms and signs: Secondary | ICD-10-CM | POA: Diagnosis not present

## 2021-06-23 ENCOUNTER — Telehealth: Payer: Self-pay | Admitting: Internal Medicine

## 2021-06-23 NOTE — Progress Notes (Addendum)
Lmom for pt to call me back. 

## 2021-06-23 NOTE — Telephone Encounter (Signed)
Please call patient, she returned call

## 2021-06-23 NOTE — Progress Notes (Signed)
Appropriate. ASA 2.  

## 2021-06-24 ENCOUNTER — Encounter: Payer: Self-pay | Admitting: *Deleted

## 2021-06-24 MED ORDER — PEG 3350-KCL-NA BICARB-NACL 420 G PO SOLR: 4000.0000 mL | Freq: Once | ORAL | 0 refills | Status: AC

## 2021-06-24 NOTE — Progress Notes (Addendum)
Pt informed me that she takes Vit D3 5000 mcg now.  Added Vit D3 5000 mcg once daily to med list.

## 2021-06-24 NOTE — Progress Notes (Signed)
Lmom for pt to call me back. 

## 2021-06-24 NOTE — Addendum Note (Signed)
Addended by: Metro Kung on: 06/24/2021 02:55 PM   Modules accepted: Orders

## 2021-06-24 NOTE — Progress Notes (Signed)
Spoke to pt.  Scheduled procedure for 07/16/2021 with arrival at 12:45.  Reviewed prep instructions with pt.  Pt aware that I sent RX to pharmacy for prep kit.  Pt informed that I am mailing out instructions.  Confirmed mailing address.

## 2021-06-24 NOTE — Telephone Encounter (Signed)
Tried to call pt back.  Had to leave a voice message.

## 2021-06-25 ENCOUNTER — Other Ambulatory Visit: Payer: Self-pay | Admitting: *Deleted

## 2021-06-28 DIAGNOSIS — M9902 Segmental and somatic dysfunction of thoracic region: Secondary | ICD-10-CM | POA: Diagnosis not present

## 2021-06-28 DIAGNOSIS — M542 Cervicalgia: Secondary | ICD-10-CM | POA: Diagnosis not present

## 2021-06-28 DIAGNOSIS — M546 Pain in thoracic spine: Secondary | ICD-10-CM | POA: Diagnosis not present

## 2021-06-28 DIAGNOSIS — M9901 Segmental and somatic dysfunction of cervical region: Secondary | ICD-10-CM | POA: Diagnosis not present

## 2021-07-02 DIAGNOSIS — M9901 Segmental and somatic dysfunction of cervical region: Secondary | ICD-10-CM | POA: Diagnosis not present

## 2021-07-02 DIAGNOSIS — M542 Cervicalgia: Secondary | ICD-10-CM | POA: Diagnosis not present

## 2021-07-02 DIAGNOSIS — M9902 Segmental and somatic dysfunction of thoracic region: Secondary | ICD-10-CM | POA: Diagnosis not present

## 2021-07-02 DIAGNOSIS — M546 Pain in thoracic spine: Secondary | ICD-10-CM | POA: Diagnosis not present

## 2021-07-15 ENCOUNTER — Telehealth: Payer: Self-pay | Admitting: *Deleted

## 2021-07-15 NOTE — Telephone Encounter (Signed)
Pt called in and said that she got hot and sweaty and passed out this morning.  This is the second episode since Jan.  Threw up after she came to.  Pt calling her PCP this morning to follow up on this issue.  Pt requested to cancel procedure for now.  Will get back in touch with me once she finds out what is going on and it is safe for her to reschedule.  Called Kim in Endo and made her aware.

## 2021-07-16 ENCOUNTER — Encounter (HOSPITAL_COMMUNITY): Admission: RE | Payer: Self-pay | Source: Home / Self Care

## 2021-07-16 ENCOUNTER — Ambulatory Visit (HOSPITAL_COMMUNITY): Admission: RE | Admit: 2021-07-16 | Payer: Medicare HMO | Source: Home / Self Care

## 2021-07-16 SURGERY — COLONOSCOPY WITH PROPOFOL
Anesthesia: Monitor Anesthesia Care

## 2021-07-19 DIAGNOSIS — M546 Pain in thoracic spine: Secondary | ICD-10-CM | POA: Diagnosis not present

## 2021-07-19 DIAGNOSIS — M9902 Segmental and somatic dysfunction of thoracic region: Secondary | ICD-10-CM | POA: Diagnosis not present

## 2021-07-19 DIAGNOSIS — M9901 Segmental and somatic dysfunction of cervical region: Secondary | ICD-10-CM | POA: Diagnosis not present

## 2021-07-19 DIAGNOSIS — M542 Cervicalgia: Secondary | ICD-10-CM | POA: Diagnosis not present

## 2021-08-03 ENCOUNTER — Other Ambulatory Visit (HOSPITAL_COMMUNITY): Payer: Self-pay | Admitting: Family Medicine

## 2021-08-03 DIAGNOSIS — E559 Vitamin D deficiency, unspecified: Secondary | ICD-10-CM | POA: Diagnosis not present

## 2021-08-03 DIAGNOSIS — Z13820 Encounter for screening for osteoporosis: Secondary | ICD-10-CM | POA: Diagnosis not present

## 2021-08-03 DIAGNOSIS — R55 Syncope and collapse: Secondary | ICD-10-CM | POA: Diagnosis not present

## 2021-08-03 DIAGNOSIS — Z136 Encounter for screening for cardiovascular disorders: Secondary | ICD-10-CM | POA: Diagnosis not present

## 2021-08-03 DIAGNOSIS — F17200 Nicotine dependence, unspecified, uncomplicated: Secondary | ICD-10-CM | POA: Diagnosis not present

## 2021-08-03 DIAGNOSIS — Z0001 Encounter for general adult medical examination with abnormal findings: Secondary | ICD-10-CM | POA: Diagnosis not present

## 2021-08-03 DIAGNOSIS — R7301 Impaired fasting glucose: Secondary | ICD-10-CM | POA: Diagnosis not present

## 2021-08-03 DIAGNOSIS — R946 Abnormal results of thyroid function studies: Secondary | ICD-10-CM | POA: Diagnosis not present

## 2021-08-03 DIAGNOSIS — Z0000 Encounter for general adult medical examination without abnormal findings: Secondary | ICD-10-CM | POA: Diagnosis not present

## 2021-08-16 DIAGNOSIS — M546 Pain in thoracic spine: Secondary | ICD-10-CM | POA: Diagnosis not present

## 2021-08-16 DIAGNOSIS — M9902 Segmental and somatic dysfunction of thoracic region: Secondary | ICD-10-CM | POA: Diagnosis not present

## 2021-08-16 DIAGNOSIS — M9901 Segmental and somatic dysfunction of cervical region: Secondary | ICD-10-CM | POA: Diagnosis not present

## 2021-08-16 DIAGNOSIS — M542 Cervicalgia: Secondary | ICD-10-CM | POA: Diagnosis not present

## 2021-11-26 ENCOUNTER — Other Ambulatory Visit (HOSPITAL_COMMUNITY): Payer: Medicare HMO

## 2021-12-03 ENCOUNTER — Inpatient Hospital Stay (HOSPITAL_COMMUNITY): Admission: RE | Admit: 2021-12-03 | Payer: Medicare HMO | Source: Ambulatory Visit

## 2021-12-13 IMAGING — MG MM DIGITAL SCREENING BILAT W/ TOMO AND CAD
6 of 10 series · 6 of 30 positions shown · non-contrast
Comparison: Previous exam(s).

CLINICAL DATA: Screening.

EXAM:
DIGITAL SCREENING BILATERAL MAMMOGRAM WITH TOMOSYNTHESIS AND CAD
TECHNIQUE: Bilateral screening digital craniocaudal and mediolateral oblique
mammograms were obtained. Bilateral screening digital breast
tomosynthesis was performed. The images were evaluated with
computer-aided detection.

[R MLO synth-2D (1 of 2)]
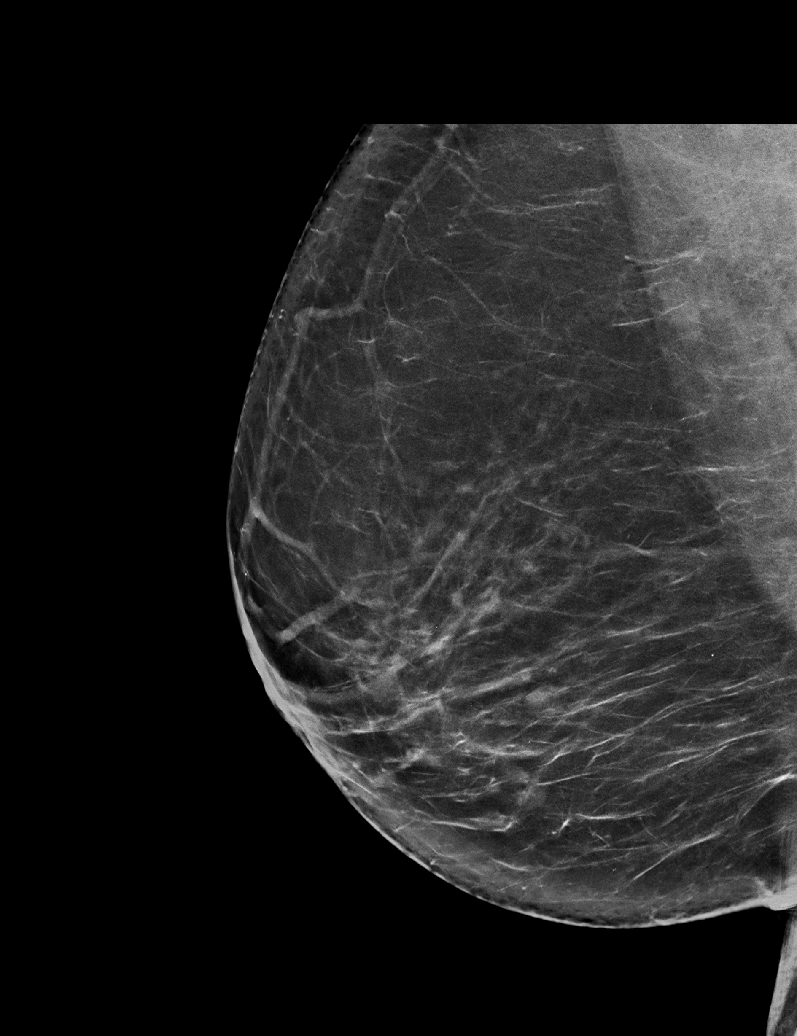

[R CC synth-2D]
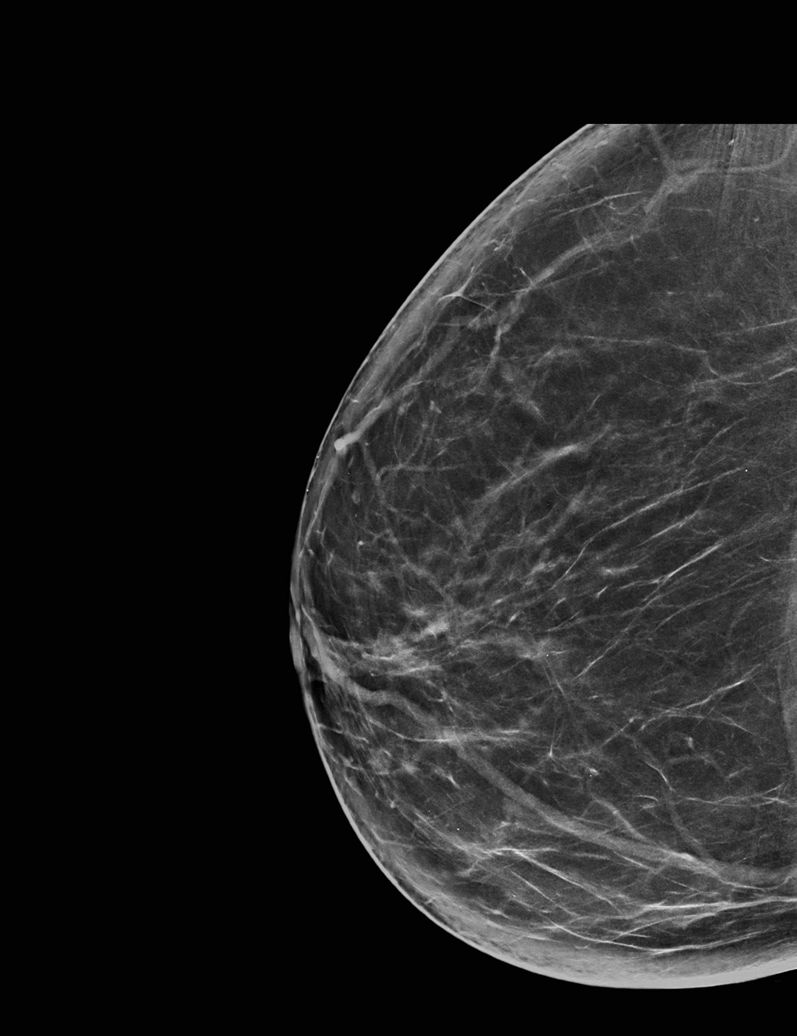

[L MLO synth-2D]
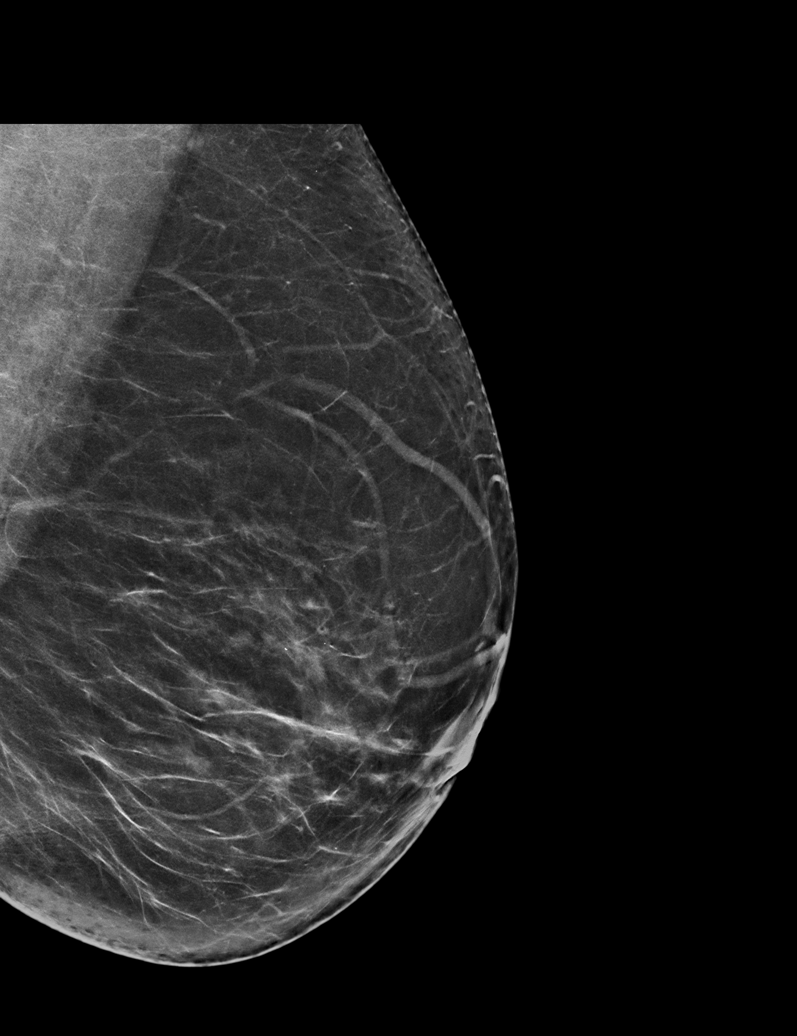

[R MLO synth-2D (2 of 2)]
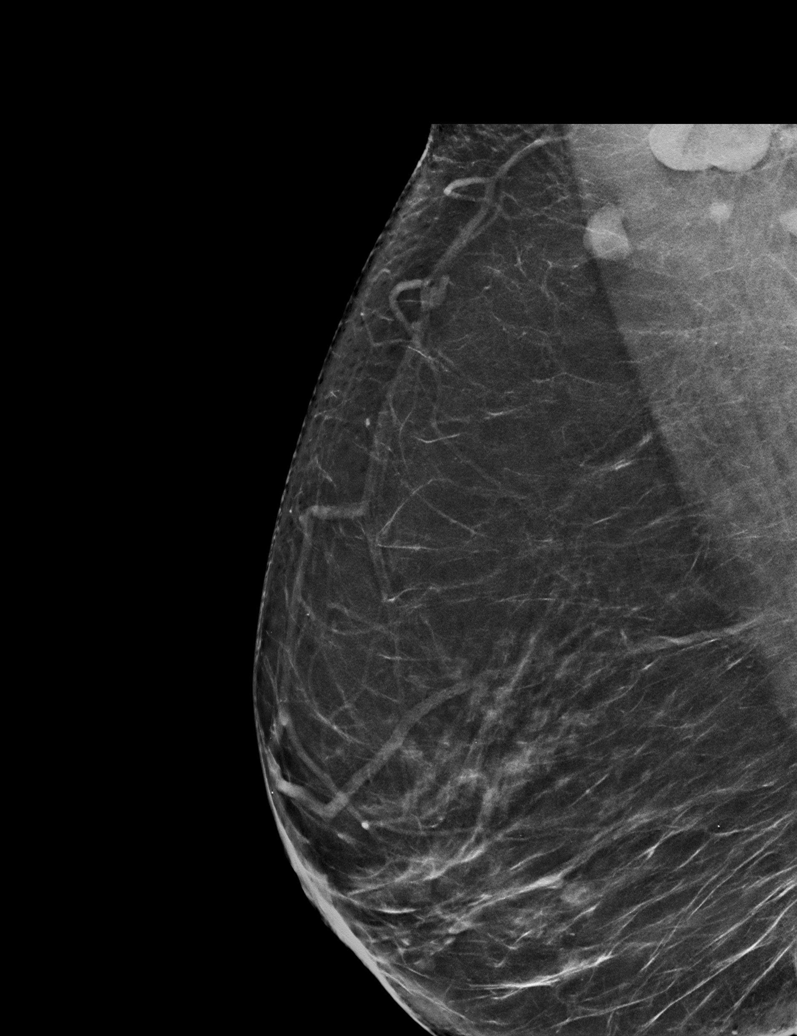

[L CC synth-2D]
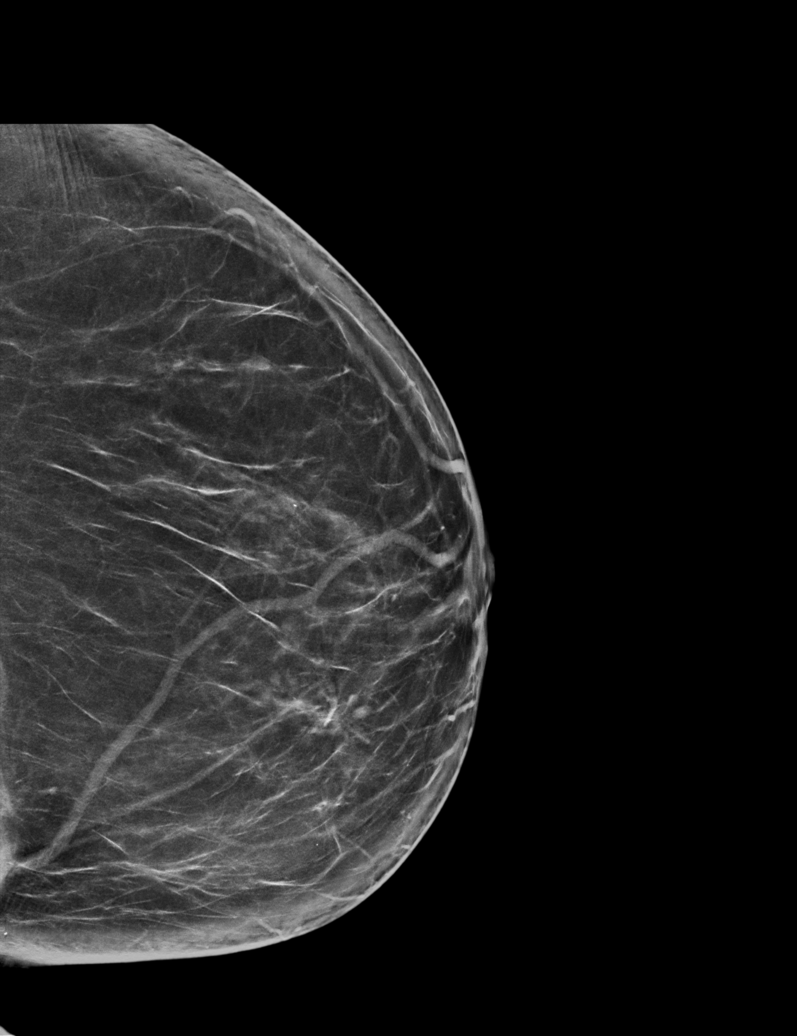

[R MLO tomo · tomo slice 39/78.0]
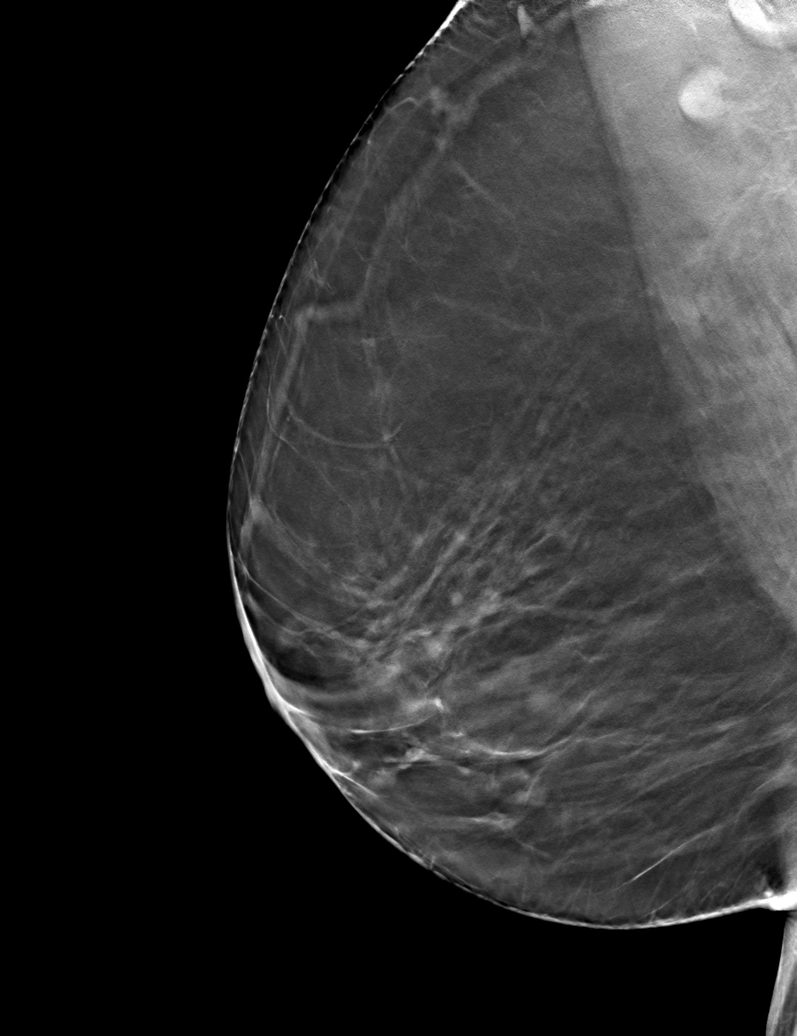

[6 of 30 positions shown; findings below may reference images not displayed]

ACR Breast Density Category b: There are scattered areas of
fibroglandular density.
FINDINGS: There are no findings suspicious for malignancy.
IMPRESSION: No mammographic evidence of malignancy. A result letter of this
screening mammogram will be mailed directly to the patient.

RECOMMENDATION:
Screening mammogram in one year. (Code:51-O-LD2)

BI-RADS CATEGORY  1: Negative.

## 2021-12-14 ENCOUNTER — Inpatient Hospital Stay (HOSPITAL_COMMUNITY): Admission: RE | Admit: 2021-12-14 | Payer: Medicare HMO | Source: Ambulatory Visit

## 2022-03-21 DIAGNOSIS — R6889 Other general symptoms and signs: Secondary | ICD-10-CM | POA: Diagnosis not present

## 2022-04-19 DIAGNOSIS — R6889 Other general symptoms and signs: Secondary | ICD-10-CM | POA: Diagnosis not present

## 2022-05-09 DIAGNOSIS — R6889 Other general symptoms and signs: Secondary | ICD-10-CM | POA: Diagnosis not present

## 2022-05-19 DIAGNOSIS — R6889 Other general symptoms and signs: Secondary | ICD-10-CM | POA: Diagnosis not present

## 2022-06-14 DIAGNOSIS — H52223 Regular astigmatism, bilateral: Secondary | ICD-10-CM | POA: Diagnosis not present

## 2022-06-22 ENCOUNTER — Other Ambulatory Visit (HOSPITAL_COMMUNITY): Payer: Self-pay | Admitting: Internal Medicine

## 2022-06-22 DIAGNOSIS — Z1231 Encounter for screening mammogram for malignant neoplasm of breast: Secondary | ICD-10-CM

## 2022-06-29 ENCOUNTER — Other Ambulatory Visit (HOSPITAL_COMMUNITY): Payer: Medicare HMO

## 2022-06-29 ENCOUNTER — Ambulatory Visit (HOSPITAL_COMMUNITY): Payer: Medicare HMO

## 2022-06-30 DIAGNOSIS — Z1211 Encounter for screening for malignant neoplasm of colon: Secondary | ICD-10-CM | POA: Diagnosis not present

## 2022-06-30 DIAGNOSIS — Z01419 Encounter for gynecological examination (general) (routine) without abnormal findings: Secondary | ICD-10-CM | POA: Diagnosis not present

## 2022-06-30 DIAGNOSIS — Z2820 Immunization not carried out because of patient decision for unspecified reason: Secondary | ICD-10-CM | POA: Diagnosis not present

## 2022-06-30 DIAGNOSIS — Z0189 Encounter for other specified special examinations: Secondary | ICD-10-CM | POA: Diagnosis not present

## 2022-07-14 ENCOUNTER — Ambulatory Visit (HOSPITAL_COMMUNITY)
Admission: RE | Admit: 2022-07-14 | Discharge: 2022-07-14 | Disposition: A | Discharge status: Home / Self Care | Payer: Medicare HMO | Source: Ambulatory Visit | Attending: Internal Medicine | Admitting: Internal Medicine

## 2022-07-14 ENCOUNTER — Ambulatory Visit (HOSPITAL_COMMUNITY)
Admission: RE | Admit: 2022-07-14 | Discharge: 2022-07-14 | Disposition: A | Discharge status: Home / Self Care | Payer: Medicare HMO | Source: Ambulatory Visit | Attending: Family Medicine | Admitting: Family Medicine

## 2022-07-14 DIAGNOSIS — Z13820 Encounter for screening for osteoporosis: Secondary | ICD-10-CM | POA: Insufficient documentation

## 2022-07-14 DIAGNOSIS — Z1231 Encounter for screening mammogram for malignant neoplasm of breast: Secondary | ICD-10-CM | POA: Insufficient documentation

## 2022-07-14 DIAGNOSIS — Z780 Asymptomatic menopausal state: Secondary | ICD-10-CM | POA: Insufficient documentation

## 2022-08-12 DIAGNOSIS — E559 Vitamin D deficiency, unspecified: Secondary | ICD-10-CM | POA: Diagnosis not present

## 2022-08-12 DIAGNOSIS — Z139 Encounter for screening, unspecified: Secondary | ICD-10-CM | POA: Diagnosis not present

## 2022-08-12 DIAGNOSIS — D649 Anemia, unspecified: Secondary | ICD-10-CM | POA: Diagnosis not present

## 2022-08-12 DIAGNOSIS — R7301 Impaired fasting glucose: Secondary | ICD-10-CM | POA: Diagnosis not present

## 2022-08-18 ENCOUNTER — Other Ambulatory Visit (HOSPITAL_COMMUNITY): Payer: Self-pay | Admitting: Internal Medicine

## 2022-08-18 DIAGNOSIS — Z0001 Encounter for general adult medical examination with abnormal findings: Secondary | ICD-10-CM | POA: Diagnosis not present

## 2022-08-18 DIAGNOSIS — E782 Mixed hyperlipidemia: Secondary | ICD-10-CM | POA: Diagnosis not present

## 2022-08-18 DIAGNOSIS — E559 Vitamin D deficiency, unspecified: Secondary | ICD-10-CM | POA: Diagnosis not present

## 2022-08-18 DIAGNOSIS — F17210 Nicotine dependence, cigarettes, uncomplicated: Secondary | ICD-10-CM | POA: Diagnosis not present

## 2022-08-18 DIAGNOSIS — E1169 Type 2 diabetes mellitus with other specified complication: Secondary | ICD-10-CM | POA: Diagnosis not present

## 2022-08-18 DIAGNOSIS — R55 Syncope and collapse: Secondary | ICD-10-CM | POA: Diagnosis not present

## 2022-08-18 DIAGNOSIS — R946 Abnormal results of thyroid function studies: Secondary | ICD-10-CM | POA: Diagnosis not present

## 2022-08-18 DIAGNOSIS — E118 Type 2 diabetes mellitus with unspecified complications: Secondary | ICD-10-CM | POA: Diagnosis not present

## 2022-08-18 DIAGNOSIS — E663 Overweight: Secondary | ICD-10-CM | POA: Diagnosis not present

## 2022-11-30 DIAGNOSIS — M9902 Segmental and somatic dysfunction of thoracic region: Secondary | ICD-10-CM | POA: Diagnosis not present

## 2022-11-30 DIAGNOSIS — M542 Cervicalgia: Secondary | ICD-10-CM | POA: Diagnosis not present

## 2022-11-30 DIAGNOSIS — M546 Pain in thoracic spine: Secondary | ICD-10-CM | POA: Diagnosis not present

## 2022-11-30 DIAGNOSIS — M9901 Segmental and somatic dysfunction of cervical region: Secondary | ICD-10-CM | POA: Diagnosis not present

## 2022-12-02 DIAGNOSIS — M542 Cervicalgia: Secondary | ICD-10-CM | POA: Diagnosis not present

## 2022-12-02 DIAGNOSIS — M546 Pain in thoracic spine: Secondary | ICD-10-CM | POA: Diagnosis not present

## 2022-12-02 DIAGNOSIS — M9902 Segmental and somatic dysfunction of thoracic region: Secondary | ICD-10-CM | POA: Diagnosis not present

## 2022-12-02 DIAGNOSIS — M9901 Segmental and somatic dysfunction of cervical region: Secondary | ICD-10-CM | POA: Diagnosis not present

## 2022-12-14 DIAGNOSIS — R946 Abnormal results of thyroid function studies: Secondary | ICD-10-CM | POA: Diagnosis not present

## 2022-12-14 DIAGNOSIS — E782 Mixed hyperlipidemia: Secondary | ICD-10-CM | POA: Diagnosis not present

## 2022-12-14 DIAGNOSIS — E559 Vitamin D deficiency, unspecified: Secondary | ICD-10-CM | POA: Diagnosis not present

## 2022-12-14 DIAGNOSIS — E118 Type 2 diabetes mellitus with unspecified complications: Secondary | ICD-10-CM | POA: Diagnosis not present

## 2022-12-28 DIAGNOSIS — E559 Vitamin D deficiency, unspecified: Secondary | ICD-10-CM | POA: Diagnosis not present

## 2022-12-28 DIAGNOSIS — H6692 Otitis media, unspecified, left ear: Secondary | ICD-10-CM | POA: Diagnosis not present

## 2022-12-28 DIAGNOSIS — E663 Overweight: Secondary | ICD-10-CM | POA: Diagnosis not present

## 2022-12-28 DIAGNOSIS — F17200 Nicotine dependence, unspecified, uncomplicated: Secondary | ICD-10-CM | POA: Diagnosis not present

## 2022-12-28 DIAGNOSIS — R55 Syncope and collapse: Secondary | ICD-10-CM | POA: Diagnosis not present

## 2022-12-28 DIAGNOSIS — E782 Mixed hyperlipidemia: Secondary | ICD-10-CM | POA: Diagnosis not present

## 2022-12-28 DIAGNOSIS — Z6828 Body mass index (BMI) 28.0-28.9, adult: Secondary | ICD-10-CM | POA: Diagnosis not present

## 2022-12-28 DIAGNOSIS — E118 Type 2 diabetes mellitus with unspecified complications: Secondary | ICD-10-CM | POA: Diagnosis not present

## 2022-12-28 DIAGNOSIS — R946 Abnormal results of thyroid function studies: Secondary | ICD-10-CM | POA: Diagnosis not present

## 2023-04-25 DIAGNOSIS — E782 Mixed hyperlipidemia: Secondary | ICD-10-CM | POA: Diagnosis not present

## 2023-04-25 DIAGNOSIS — E118 Type 2 diabetes mellitus with unspecified complications: Secondary | ICD-10-CM | POA: Diagnosis not present

## 2023-05-02 DIAGNOSIS — F17210 Nicotine dependence, cigarettes, uncomplicated: Secondary | ICD-10-CM | POA: Diagnosis not present

## 2023-05-02 DIAGNOSIS — E782 Mixed hyperlipidemia: Secondary | ICD-10-CM | POA: Diagnosis not present

## 2023-05-02 DIAGNOSIS — L608 Other nail disorders: Secondary | ICD-10-CM | POA: Diagnosis not present

## 2023-05-02 DIAGNOSIS — E559 Vitamin D deficiency, unspecified: Secondary | ICD-10-CM | POA: Diagnosis not present

## 2023-05-02 DIAGNOSIS — E663 Overweight: Secondary | ICD-10-CM | POA: Diagnosis not present

## 2023-05-02 DIAGNOSIS — E118 Type 2 diabetes mellitus with unspecified complications: Secondary | ICD-10-CM | POA: Diagnosis not present

## 2023-05-02 DIAGNOSIS — Z7182 Exercise counseling: Secondary | ICD-10-CM | POA: Diagnosis not present

## 2023-05-02 DIAGNOSIS — E1165 Type 2 diabetes mellitus with hyperglycemia: Secondary | ICD-10-CM | POA: Diagnosis not present

## 2023-05-02 DIAGNOSIS — R946 Abnormal results of thyroid function studies: Secondary | ICD-10-CM | POA: Diagnosis not present

## 2023-08-12 NOTE — Progress Notes (Signed)
 Pt screened for HTN. Pt educated on healthy lifestyle and to follow up with PCP in regards to her BP. She stated understanding

## 2023-08-29 DIAGNOSIS — E1165 Type 2 diabetes mellitus with hyperglycemia: Secondary | ICD-10-CM | POA: Diagnosis not present

## 2023-08-29 DIAGNOSIS — E782 Mixed hyperlipidemia: Secondary | ICD-10-CM | POA: Diagnosis not present

## 2023-09-06 ENCOUNTER — Other Ambulatory Visit (HOSPITAL_COMMUNITY): Payer: Self-pay | Admitting: Internal Medicine

## 2023-09-06 DIAGNOSIS — E559 Vitamin D deficiency, unspecified: Secondary | ICD-10-CM | POA: Diagnosis not present

## 2023-09-06 DIAGNOSIS — E782 Mixed hyperlipidemia: Secondary | ICD-10-CM | POA: Diagnosis not present

## 2023-09-06 DIAGNOSIS — R946 Abnormal results of thyroid function studies: Secondary | ICD-10-CM | POA: Diagnosis not present

## 2023-09-06 DIAGNOSIS — F17200 Nicotine dependence, unspecified, uncomplicated: Secondary | ICD-10-CM | POA: Diagnosis not present

## 2023-09-06 DIAGNOSIS — L219 Seborrheic dermatitis, unspecified: Secondary | ICD-10-CM | POA: Diagnosis not present

## 2023-09-06 DIAGNOSIS — Z23 Encounter for immunization: Secondary | ICD-10-CM | POA: Diagnosis not present

## 2023-09-06 DIAGNOSIS — E663 Overweight: Secondary | ICD-10-CM | POA: Diagnosis not present

## 2023-09-06 DIAGNOSIS — E1169 Type 2 diabetes mellitus with other specified complication: Secondary | ICD-10-CM | POA: Diagnosis not present

## 2023-09-06 DIAGNOSIS — Z0001 Encounter for general adult medical examination with abnormal findings: Secondary | ICD-10-CM | POA: Diagnosis not present

## 2023-09-14 ENCOUNTER — Encounter: Payer: Self-pay | Admitting: *Deleted

## 2023-09-14 NOTE — Progress Notes (Signed)
 Pt attended 08/12/23 screening event with BP of 144/82. Pt noted at event that she does have a PCP. At event pt did not indicate any SDOH needs. Pt also noted that she is a smoker and listed Medicaid as her insurance at the event.   Per chart review pt does have a PCP (Sally Saunders; Sally Saunders, insurance, and is a smoker. Pt's last appt with PCP was 05/02/2023 and has an upcoming radiology appt on 10/18/2023. Pt does not indicate any SDOH needs at this time.  No additional pt f/u to be scheduled at this time per health equity protocol.

## 2023-10-18 ENCOUNTER — Encounter (HOSPITAL_COMMUNITY): Payer: Self-pay

## 2023-10-18 ENCOUNTER — Inpatient Hospital Stay (HOSPITAL_COMMUNITY): Admission: RE | Admit: 2023-10-18 | Source: Ambulatory Visit

## 2024-02-20 DIAGNOSIS — L821 Other seborrheic keratosis: Secondary | ICD-10-CM | POA: Diagnosis not present

## 2024-02-20 DIAGNOSIS — L820 Inflamed seborrheic keratosis: Secondary | ICD-10-CM | POA: Diagnosis not present

## 2024-02-20 DIAGNOSIS — R208 Other disturbances of skin sensation: Secondary | ICD-10-CM | POA: Diagnosis not present

## 2024-02-20 DIAGNOSIS — L538 Other specified erythematous conditions: Secondary | ICD-10-CM | POA: Diagnosis not present

## 2024-03-15 ENCOUNTER — Encounter (INDEPENDENT_AMBULATORY_CARE_PROVIDER_SITE_OTHER): Payer: Self-pay | Admitting: *Deleted
